# Patient Record
Sex: Male | Born: 1935 | Race: White | Hispanic: No | Smoking: Former smoker
Health system: Southern US, Community
[De-identification: ages and names within clinical notes are randomized; demographics above are authoritative.]

## PROBLEM LIST (undated history)

## (undated) DIAGNOSIS — I2699 Other pulmonary embolism without acute cor pulmonale: Secondary | ICD-10-CM

## (undated) DIAGNOSIS — E7800 Pure hypercholesterolemia, unspecified: Secondary | ICD-10-CM

## (undated) DIAGNOSIS — I4891 Unspecified atrial fibrillation: Secondary | ICD-10-CM

## (undated) DIAGNOSIS — Z5189 Encounter for other specified aftercare: Secondary | ICD-10-CM

## (undated) DIAGNOSIS — I2510 Atherosclerotic heart disease of native coronary artery without angina pectoris: Secondary | ICD-10-CM

## (undated) DIAGNOSIS — I82409 Acute embolism and thrombosis of unspecified deep veins of unspecified lower extremity: Secondary | ICD-10-CM

## (undated) HISTORY — PX: HIP SURGERY: SHX245

## (undated) HISTORY — PX: CORONARY ARTERY BYPASS GRAFT: SHX141

## (undated) HISTORY — PX: PARTIAL HIP ARTHROPLASTY: SHX733

## (undated) HISTORY — PX: IVC FILTER PLACEMENT (ARMC HX): HXRAD1551

## (undated) HISTORY — PX: CERVICAL SPINE SURGERY: SHX589

---

## 2002-12-28 ENCOUNTER — Encounter: Payer: Self-pay | Admitting: Orthopedic Surgery

## 2002-12-31 ENCOUNTER — Encounter: Payer: Self-pay | Admitting: Orthopedic Surgery

## 2002-12-31 ENCOUNTER — Inpatient Hospital Stay (HOSPITAL_COMMUNITY): Admission: RE | Admit: 2002-12-31 | Discharge: 2003-01-04 | Payer: Self-pay | Admitting: Orthopedic Surgery

## 2015-12-17 ENCOUNTER — Encounter (HOSPITAL_BASED_OUTPATIENT_CLINIC_OR_DEPARTMENT_OTHER): Payer: Self-pay | Admitting: *Deleted

## 2015-12-17 ENCOUNTER — Emergency Department (HOSPITAL_BASED_OUTPATIENT_CLINIC_OR_DEPARTMENT_OTHER)
Admission: EM | Admit: 2015-12-17 | Discharge: 2015-12-17 | Disposition: A | Discharge status: Home / Self Care | Payer: Medicare Other | Attending: Emergency Medicine | Admitting: Emergency Medicine

## 2015-12-17 ENCOUNTER — Emergency Department (HOSPITAL_BASED_OUTPATIENT_CLINIC_OR_DEPARTMENT_OTHER): Payer: Medicare Other

## 2015-12-17 ENCOUNTER — Telehealth (HOSPITAL_COMMUNITY): Payer: Self-pay

## 2015-12-17 DIAGNOSIS — Z880 Allergy status to penicillin: Secondary | ICD-10-CM | POA: Insufficient documentation

## 2015-12-17 DIAGNOSIS — E7800 Pure hypercholesterolemia, unspecified: Secondary | ICD-10-CM | POA: Insufficient documentation

## 2015-12-17 DIAGNOSIS — I2510 Atherosclerotic heart disease of native coronary artery without angina pectoris: Secondary | ICD-10-CM | POA: Insufficient documentation

## 2015-12-17 DIAGNOSIS — Z951 Presence of aortocoronary bypass graft: Secondary | ICD-10-CM | POA: Diagnosis not present

## 2015-12-17 DIAGNOSIS — Z86711 Personal history of pulmonary embolism: Secondary | ICD-10-CM | POA: Insufficient documentation

## 2015-12-17 DIAGNOSIS — Z791 Long term (current) use of non-steroidal anti-inflammatories (NSAID): Secondary | ICD-10-CM | POA: Insufficient documentation

## 2015-12-17 DIAGNOSIS — Z79899 Other long term (current) drug therapy: Secondary | ICD-10-CM | POA: Diagnosis not present

## 2015-12-17 DIAGNOSIS — Z87891 Personal history of nicotine dependence: Secondary | ICD-10-CM | POA: Insufficient documentation

## 2015-12-17 DIAGNOSIS — M79605 Pain in left leg: Secondary | ICD-10-CM | POA: Diagnosis present

## 2015-12-17 DIAGNOSIS — I82432 Acute embolism and thrombosis of left popliteal vein: Secondary | ICD-10-CM | POA: Diagnosis not present

## 2015-12-17 HISTORY — DX: Acute embolism and thrombosis of unspecified deep veins of unspecified lower extremity: I82.409

## 2015-12-17 HISTORY — DX: Other pulmonary embolism without acute cor pulmonale: I26.99

## 2015-12-17 HISTORY — DX: Atherosclerotic heart disease of native coronary artery without angina pectoris: I25.10

## 2015-12-17 HISTORY — DX: Encounter for other specified aftercare: Z51.89

## 2015-12-17 HISTORY — DX: Pure hypercholesterolemia, unspecified: E78.00

## 2015-12-17 HISTORY — DX: Unspecified atrial fibrillation: I48.91

## 2015-12-17 LAB — BASIC METABOLIC PANEL
ANION GAP: 4 — AB (ref 5–15)
BUN: 26 mg/dL — ABNORMAL HIGH (ref 6–20)
CO2: 24 mmol/L (ref 22–32)
Calcium: 8.6 mg/dL — ABNORMAL LOW (ref 8.9–10.3)
Chloride: 109 mmol/L (ref 101–111)
Creatinine, Ser: 1.43 mg/dL — ABNORMAL HIGH (ref 0.61–1.24)
GFR calc Af Amer: 52 mL/min — ABNORMAL LOW (ref >60)
GFR calc non Af Amer: 45 mL/min — ABNORMAL LOW (ref >60)
GLUCOSE: 111 mg/dL — AB (ref 65–99)
POTASSIUM: 4.5 mmol/L (ref 3.5–5.1)
Sodium: 137 mmol/L (ref 135–145)

## 2015-12-17 LAB — PROTIME-INR
INR: 0.95 (ref 0.00–1.49)
Prothrombin Time: 12.9 seconds (ref 11.6–15.2)

## 2015-12-17 MED ORDER — ENOXAPARIN SODIUM 40 MG/0.4ML ~~LOC~~ SOLN: 75.0000 mg | Freq: Two times a day (BID) | SUBCUTANEOUS | Status: AC

## 2015-12-17 MED ORDER — WARFARIN SODIUM 5 MG PO TABS: 5.0000 mg | ORAL_TABLET | Freq: Every day | ORAL | Status: AC

## 2015-12-17 MED ORDER — ENOXAPARIN SODIUM 80 MG/0.8ML ~~LOC~~ SOLN
77.0000 mg | Freq: Once | SUBCUTANEOUS | Status: AC
  Administered 2015-12-17: 75 mg via SUBCUTANEOUS
  Filled 2015-12-17: qty 0.8

## 2015-12-17 NOTE — ED Notes (Signed)
Pt voided in urinal

## 2015-12-17 NOTE — Telephone Encounter (Signed)
Incoming call from pharmacy regarding clarification of Lovenox Rx.  Call transferred to prescriber Anselmo RodSamantha N. Riley PA for clarification.

## 2015-12-17 NOTE — ED Notes (Signed)
Pt reports pain posterior right knee since yesterday- hx of blood clots- had IVC filter placed last year- [facility]- no longer taking coumadin

## 2015-12-17 NOTE — ED Notes (Signed)
PA at bedside.

## 2015-12-17 NOTE — ED Provider Notes (Signed)
CSN: [id number]     Arrival date & time 12/17/15  1133 History   First MD Initiated Contact with Patient 12/17/15 1208     Chief Complaint  Patient presents with  . Leg Pain   HPI   [name] 81 [facility] M PMH significant for DVT, PE, IVC filter (placed last July) presenting with a posterior right knee pain since yesterday. He endorses right popliteal pain since yesterday. He describes the pain as 4/10 pain scale, dull, non-radiating, constant. He states with his first DVT, he had significant pain, but with his last DVT, he did not have pain at all, so he does not know how this pain compares to previous DVTs. [facility]. No fevers, chills, injury, CP, SOB, palpitations, leg swelling, color changes.   Past Medical History  Diagnosis Date  . A-fib (HCC)   . DVT (deep venous thrombosis) (HCC)   . PE (pulmonary embolism)   . Coronary artery disease   . High cholesterol   . Blood transfusion without reported diagnosis    Past Surgical History  Procedure Laterality Date  . Coronary artery bypass graft    . Ivc filter placement ([facility] hx)    . Partial hip arthroplasty    . Hip surgery    . Cervical spine surgery     No family history on file. Social History  Substance Use Topics  . Smoking status: Former Games developermoker  . Smokeless tobacco: Never Used  . Alcohol Use: No    Review of Systems  Ten systems are reviewed and are negative for acute change except as noted in the HPI  Allergies  Levaquin and Penicillins  Home Medications   Prior to Admission medications   Medication Sig Start Date End Date Taking? Authorizing Provider  atorvastatin (LIPITOR) 80 MG tablet Take 80 mg by mouth daily.   Yes Historical Provider, MD  diclofenac (VOLTAREN) 75 MG EC tablet Take 75 mg by mouth 2 (two) times daily.   Yes Historical Provider, MD  omeprazole (PRILOSEC) 20 MG capsule Take 20 mg by mouth daily.   Yes Historical Provider, MD  polyethylene glycol (MIRALAX / GLYCOLAX)  packet Take 17 g by mouth daily as needed.   Yes Historical Provider, MD   BP 178/60 mmHg  Pulse 70  Temp(Src) 98.7 F (37.1 C) (Oral)  Resp 16  SpO2 98% Physical Exam  Constitutional: He appears well-developed and well-nourished. No distress.  HENT:  Head: Normocephalic and atraumatic.  Mouth/Throat: Oropharynx is clear and moist. No oropharyngeal exudate.  Eyes: Conjunctivae are normal. Pupils are equal, round, and reactive to light. Right eye exhibits no discharge. Left eye exhibits no discharge. No scleral icterus.  Neck: No tracheal deviation present.  Cardiovascular: Normal rate, regular rhythm, normal heart sounds and intact distal pulses.  Exam reveals no gallop and no friction rub.   No murmur heard. Pulmonary/Chest: Effort normal and breath sounds normal. No respiratory distress. He has no wheezes. He has no rales. He exhibits no tenderness.  Abdominal: Soft. Bowel sounds are normal. He exhibits no distension and no mass. There is no tenderness. There is no rebound and no guarding.  Musculoskeletal: Normal range of motion. He exhibits edema and tenderness.  Left knee edematous (chronic in nature). Slight tenderness right popliteal. Right leg without swelling, pallor, erythema. Neurovascularly intact BL  Lymphadenopathy:    He has no cervical adenopathy.  Neurological: He is alert. Coordination normal.  Skin: Skin is warm and dry. No rash  noted. He is not diaphoretic. No erythema.  Psychiatric: He has a normal mood and affect. His behavior is normal.  Nursing note and vitals reviewed.   ED Course  Procedures  Labs Review Labs Reviewed  BASIC METABOLIC PANEL - Abnormal; Notable for the following:    Glucose, Bld 111 (*)    BUN 26 (*)    Creatinine, Ser 1.43 (*)    Calcium 8.6 (*)    GFR calc non Af Amer 45 (*)    GFR calc Af Amer 52 (*)    Anion gap 4 (*)    All other components within normal limits  PROTIME-INR    Imaging Review US Venous Img Lower Unilateral  Right  12/17/2015  CLINICAL DATA:  Right posterior knee pain for 2 days after prolonged cart travel. History of IVC filter. EXAM: RIGHT LOWER EXTREMITY VENOUS DOPPLER ULTRASOUND TECHNIQUE: Gray-scale sonography with graded compression, as well as color Doppler and duplex ultrasound were performed to evaluate the lower extremity deep venous systems from the level of the common femoral vein and including the common femoral, femoral, profunda femoral, popliteal and calf veins including the posterior tibial, peroneal and gastrocnemius veins when visible. The superficial great saphenous vein was also interrogated. Spectral Doppler was utilized to evaluate flow at rest and with distal augmentation maneuvers in the common femoral, femoral and popliteal veins. COMPARISON:  None. FINDINGS: Contralateral Common Femoral Vein: Respiratory phasicity is normal and symmetric with the symptomatic side. No evidence of thrombus. Normal compressibility. Common Femoral Vein: No evidence of thrombus. Normal compressibility, respiratory phasicity and response to augmentation. Saphenofemoral Junction: No evidence of thrombus. Normal compressibility and flow on color Doppler imaging. Profunda Femoral Vein: No evidence of thrombus. Normal compressibility and flow on color Doppler imaging. Femoral Vein: No evidence of thrombus. Normal compressibility, respiratory phasicity and response to augmentation. Popliteal Vein: There is a partially occlusive blood clot within the right popliteal vein which demonstrate decreased plasticity and compressibility. Calf Veins: No evidence of thrombus. Normal compressibility and flow on color Doppler imaging. Superficial Great Saphenous Vein: No evidence of thrombus. Normal compressibility and flow on color Doppler imaging. Venous Reflux:  None. Other Findings:  None. IMPRESSION: Partially occlusive blood clot within the right popliteal vein. These results were called by telephone at the time of  interpretation on 12/17/2015 at 2:13 pm to Althea Grimmer , PA who verbally acknowledged these results. Electronically Signed   By: Ted Mcalpine M.D.   On: 12/17/2015 14:19   I have personally reviewed and evaluated these images and lab results as part of my medical decision-making.  MDM   Final diagnoses:  Acute deep vein thrombosis (DVT) of popliteal vein of left lower extremity (HCC)   Patient non-toxic appearing. Hypertensive on exam. US reveals partially occlusive blood clot within the right popliteal vein. Patient will be started on anticoagulation therapy. Discussed case with Dr. Clydene Pugh. Patient may be safely discharged home. Discussed reasons for return. Patient to follow-up with primary care provider within a few days. Patient in understanding and agreement with the plan.    Melton Krebs, PA-C 12/31/15 1403  Lyndal Pulley, MD 01/01/16 1248

## 2015-12-17 NOTE — Discharge Instructions (Signed)
Mr. [facility],  Nice meeting you! Please follow-up with your primary care provider on Tuesday. Take Lovenox twice a day and start taking Coumadin tomorrow. Do both of these until you see your doctor and get your blood levels checked. Your INR today was 0.95, and PT was 12.9. You did have an elevated creatinine of 1.43 and a BUN of 26, so stay hydrated, and follow-up with your primary care provider regarding this. Return to the emergency department if you develop increasing pain, chest pain, shortness of breath, your leg changes colors or become cold. Feel better soon!  S. Lane Hacker, PA-C   Deep Vein Thrombosis A deep vein thrombosis (DVT) is a blood clot (thrombus) that usually occurs in a deep, larger vein of the lower leg or the pelvis, or in an upper extremity such as the arm. These are dangerous and can lead to serious and even life-threatening complications if the clot travels to the lungs. A DVT can damage the valves in your leg veins so that instead of flowing upward, the blood pools in the lower leg. This is called post-thrombotic syndrome, and it can result in pain, swelling, discoloration, and sores on the leg. CAUSES A DVT is caused by the formation of a blood clot in your leg, pelvis, or arm. Usually, several things contribute to the formation of blood clots. A clot may develop when:  Your blood flow slows down.  Your vein becomes damaged in some way.  You have a condition that makes your blood clot more easily. RISK FACTORS A DVT is more likely to develop in:  People who are older, especially over 30 years of age.  People who are overweight (obese).  People who sit or lie still for a long time, such as during long-distance travel (over 4 hours), bed rest, hospitalization, or during recovery from certain medical conditions like a stroke.  People who do not engage in much physical activity (sedentary lifestyle).  People who have chronic breathing disorders.  People  who have a personal or family history of blood clots or blood clotting disease.  People who have peripheral vascular disease (PVD), diabetes, or some types of cancer.  People who have heart disease, especially if the person had a recent heart attack or has congestive heart failure.  People who have neurological diseases that affect the legs (leg paresis).  People who have had a traumatic injury, such as breaking a hip or leg.  People who have recently had major or lengthy surgery, especially on the hip, knee, or abdomen.  People who have had a central line placed inside a large vein.  People who take medicines that contain the hormone estrogen. These include birth control pills and hormone replacement therapy.  Pregnancy or during childbirth or the postpartum period.  Long plane flights (over 8 hours). SIGNS AND SYMPTOMS Symptoms of a DVT can include:   Swelling of your leg or arm, especially if one side is much worse.  Warmth and redness of your leg or arm, especially if one side is much worse.  Pain in your arm or leg. If the clot is in your leg, symptoms may be more noticeable or worse when you stand or walk.  A feeling of pins and needles, if the clot is in the arm. The symptoms of a DVT that has traveled to the lungs (pulmonary embolism, PE) usually start suddenly and include:  Shortness of breath while active or at rest.  Coughing or coughing up blood or blood-tinged  mucus.  Chest pain that is often worse with deep breaths.  Rapid or irregular heartbeat.  Feeling light-headed or dizzy.  Fainting.  Feeling anxious.  Sweating. There may also be pain and swelling in a leg if that is where the blood clot started. These symptoms may represent a serious problem that is an emergency. Do not wait to see if the symptoms will go away. Get medical help right away. Call your local emergency services (911 in the [place] Do not drive yourself to the hospital. DIAGNOSIS Your  health care provider will take a medical history and perform a physical exam. You may also have other tests, including:  Blood tests to assess the clotting properties of your blood.  Imaging tests, such as CT, ultrasound, MRI, X-ray, and other tests to see if you have clots anywhere in your body. TREATMENT After a DVT is identified, it can be treated. The type of treatment that you receive depends on many factors, such as the cause of your DVT, your risk for bleeding or developing more clots, and other medical conditions that you have. Sometimes, a combination of treatments is necessary. Treatment options may be combined and include:  Monitoring the blood clot with ultrasound.  Taking medicines by mouth, such as newer blood thinners (anticoagulants), thrombolytics, or warfarin.  Taking anticoagulant medicine by injection or through an IV tube.  Wearing compression stockings or using different types ofdevices.  Surgery (rare) to remove the blood clot or to place a filter in your abdomen to stop the blood clot from traveling to your lungs. Treatments for a DVT are often divided into immediate treatment and long-term treatment (up to 3 months after DVT). You can work with your health care provider to choose the treatment program that is best for you. HOME CARE INSTRUCTIONS If you are taking a newer oral anticoagulant:  Take the medicine every single day at the same time each day.  Understand what foods and drugs interact with this medicine.  Understand that there are no regular blood tests required when using this medicine.  Understand the side effects of this medicine, including excessive bruising or bleeding. Ask your health care provider or pharmacist about other possible side effects. If you are taking warfarin:  Understand how to take warfarin and know which foods can affect how warfarin works in Public relations account executive.  Understand that it is dangerous to take too much or too little warfarin.  Too much warfarin increases the risk of bleeding. Too little warfarin continues to allow the risk for blood clots.  Follow your PT and INR blood testing schedule. The PT and INR results allow your health care provider to adjust your dose of warfarin. It is very important that you have your PT and INR tested as often as told by your health care provider.  Avoid major changes in your diet, or tell your health care provider before you change your diet. Arrange a visit with a registered dietitian to answer your questions. Many foods, especially foods that are high in vitamin K, can interfere with warfarin and affect the PT and INR results. Eat a consistent amount of foods that are high in vitamin K, such as:  Spinach, kale, broccoli, cabbage, collard greens, turnip greens, Brussels sprouts, peas, cauliflower, seaweed, and parsley.  Beef liver and pork liver.  Green tea.  Soybean oil.  Tell your health care provider about any and all medicines, vitamins, and supplements that you take, including aspirin and other over-the-counter anti-inflammatory medicines. Be especially  cautious with aspirin and anti-inflammatory medicines. Do not take those before you ask your health care provider if it is safe to do so. This is important because many medicines can interfere with warfarin and affect the PT and INR results.  Do not start or stop taking any over-the-counter or prescription medicine unless your health care provider or pharmacist tells you to do so. If you take warfarin, you will also need to do these things:  Hold pressure over cuts for longer than usual.  Tell your dentist and other health care providers that you are taking warfarin before you have any procedures in which bleeding may occur.  Avoid alcohol or drink very small amounts. Tell your health care provider if you change your alcohol intake.  Do not use tobacco products, including cigarettes, chewing tobacco, and e-cigarettes. If you need  help quitting, ask your health care provider.  Avoid contact sports. General Instructions  Take over-the-counter and prescription medicines only as told by your health care provider. Anticoagulant medicines can have side effects, including easy bruising and difficulty stopping bleeding. If you are prescribed an anticoagulant, you will also need to do these things:  Hold pressure over cuts for longer than usual.  Tell your dentist and other health care providers that you are taking anticoagulants before you have any procedures in which bleeding may occur.  Avoid contact sports.  Wear a medical alert bracelet or carry a medical alert card that says you have had a PE.  Ask your health care provider how soon you can go back to your normal activities. Stay active to prevent new blood clots from forming.  Make sure to exercise while traveling or when you have been sitting or standing for a long period of time. It is very important to exercise. Exercise your legs by walking or by tightening and relaxing your leg muscles often. Take frequent walks.  Wear compression stockings as told by your health care provider to help prevent more blood clots from forming.  Do not use tobacco products, including cigarettes, chewing tobacco, and e-cigarettes. If you need help quitting, ask your health care provider.  Keep all follow-up appointments with your health care provider. This is important. PREVENTION Take these actions to decrease your risk of developing another DVT:  Exercise regularly. For at least 30 minutes every day, engage in:  Activity that involves moving your arms and legs.  Activity that encourages good blood flow through your body by increasing your heart rate.  Exercise your arms and legs every hour during long-distance travel (over 4 hours). Drink plenty of water and avoid drinking alcohol while traveling.  Avoid sitting or lying in bed for long periods of time without moving your  legs.  Maintain a weight that is appropriate for your height. Ask your health care provider what weight is healthy for you.  If you are a woman who is over 79 years of age, avoid unnecessary use of medicines that contain estrogen. These include birth control pills.  Do not smoke, especially if you take estrogen medicines. If you need help quitting, ask your health care provider. If you are hospitalized, prevention measures may include:  Early walking after surgery, as soon as your health care provider says that it is safe.  Receiving anticoagulants to prevent blood clots.If you cannot take anticoagulants, other options may be available, such as wearing compression stockings or using different types of devices. SEEK IMMEDIATE MEDICAL CARE IF:  You have new or increased pain, swelling, or  redness in an arm or leg.  You have numbness or tingling in an arm or leg.  You have shortness of breath while active or at rest.  You have chest pain.  You have a rapid or irregular heartbeat.  You feel light-headed or dizzy.  You cough up blood.  You notice blood in your vomit, bowel movement, or urine. These symptoms may represent a serious problem that is an emergency. Do not wait to see if the symptoms will go away. Get medical help right away. Call your local emergency services (911 in the [place] Do not drive yourself to the hospital.   This information is not intended to replace advice given to you by your health care provider. Make sure you discuss any questions you have with your health care provider.   Document Released: 12/09/2005 Document Revised: 08/30/2015 Document Reviewed: 04/05/2015 Elsevier Interactive Patient Education 2016 [email or web address]. d

## 2016-01-31 IMAGING — US US EXTREM LOW VENOUS*R*
1 series · 13 of 24 positions shown · non-contrast
Comparison: None.

CLINICAL DATA: Right posterior knee pain for 2 days after prolonged
cart travel. History of IVC filter.



[Series 1: us extrem low venous*right* · 0.08mm/px · 13 of 36 slices shown]
[im 1/36]
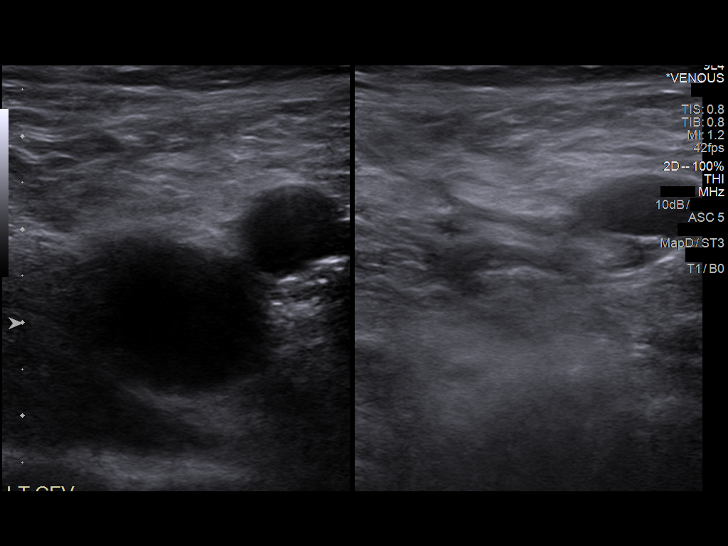
[im 4/36]
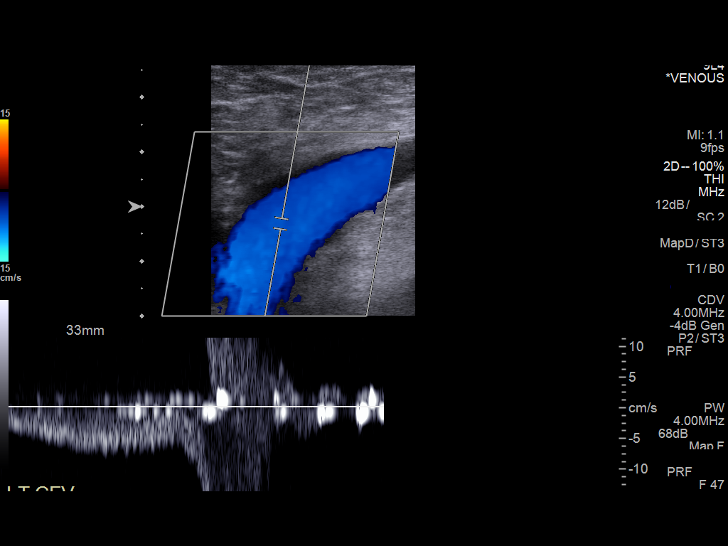
[im 7/36]
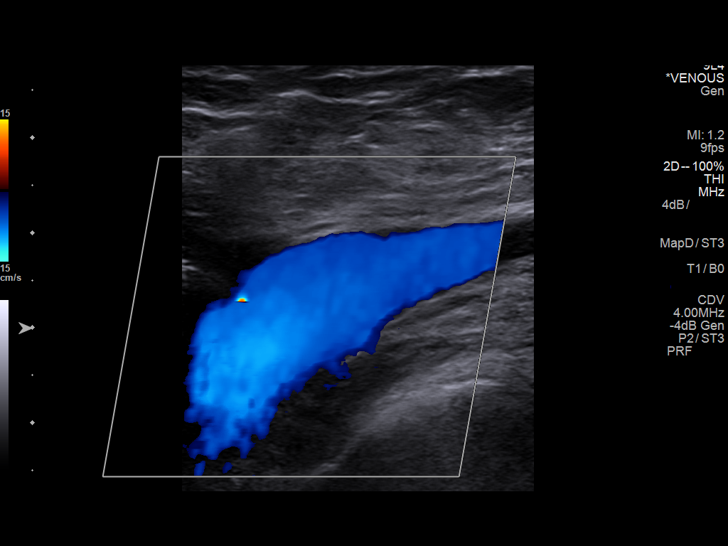
[im 10/36]
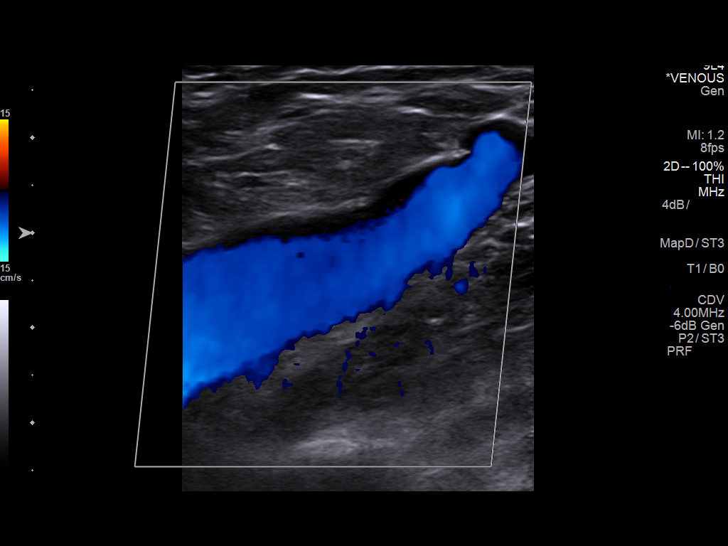
[im 13/36]
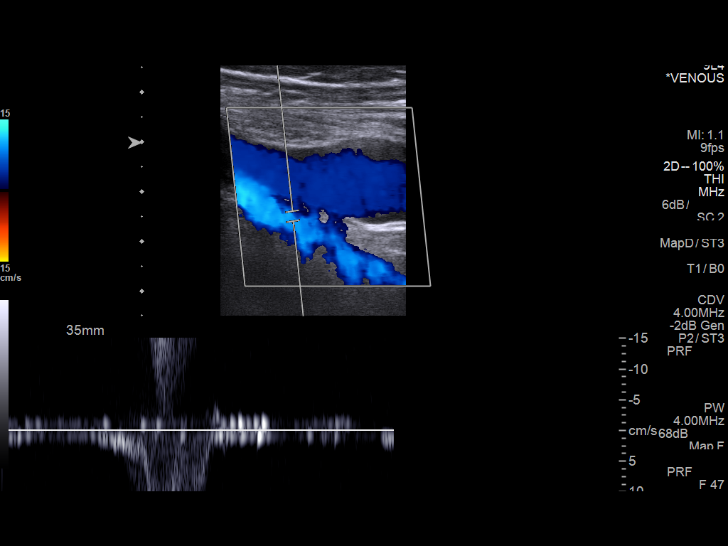
[im 16/36]
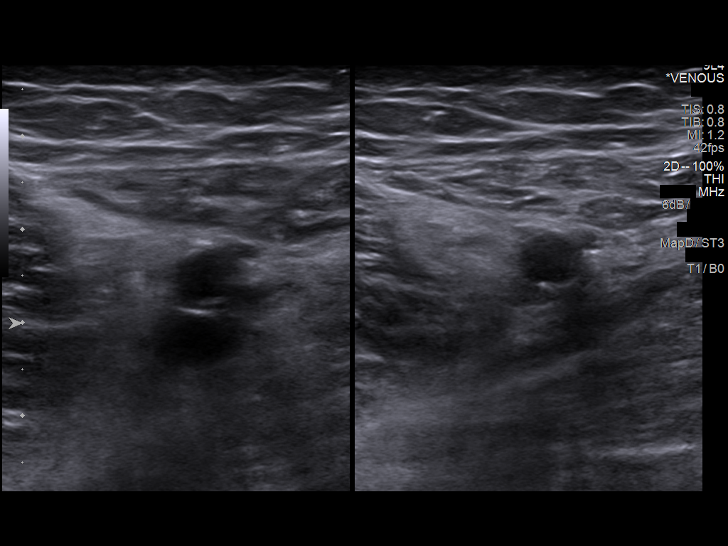
[im 19/36]
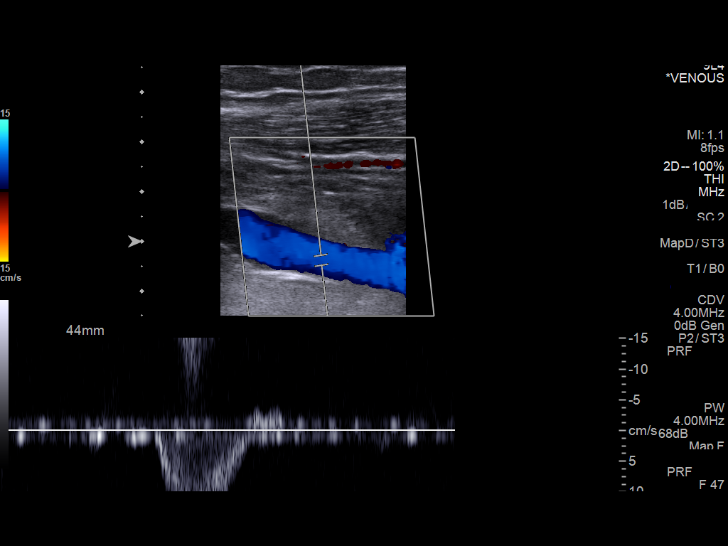
[im 20/36]
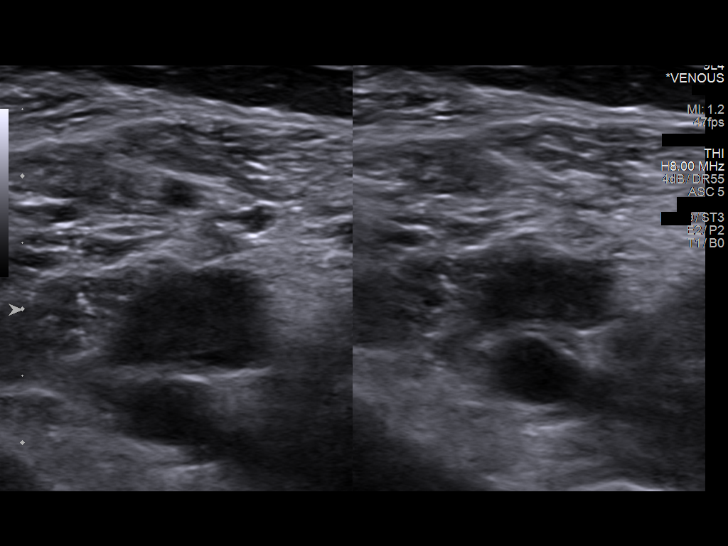
[im 23/36]
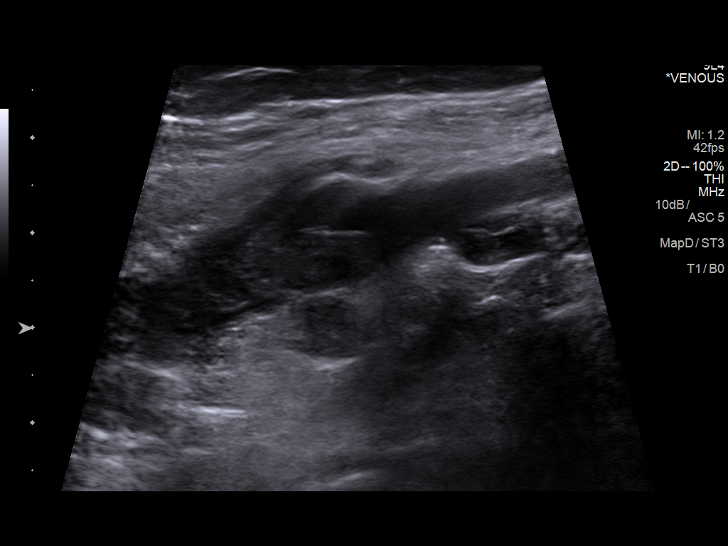
[im 26/36]
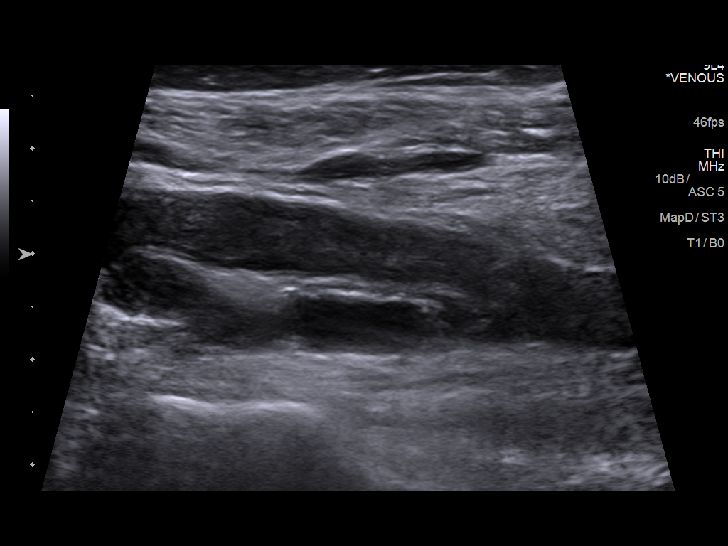
[im 29/36]
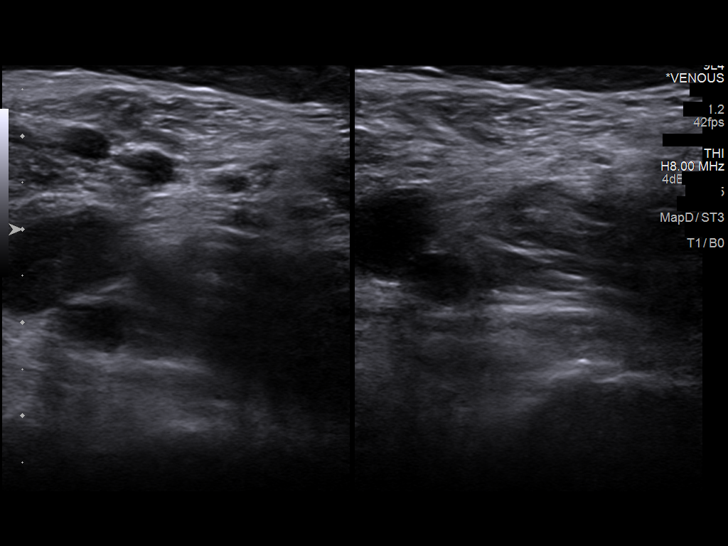
[im 32/36]
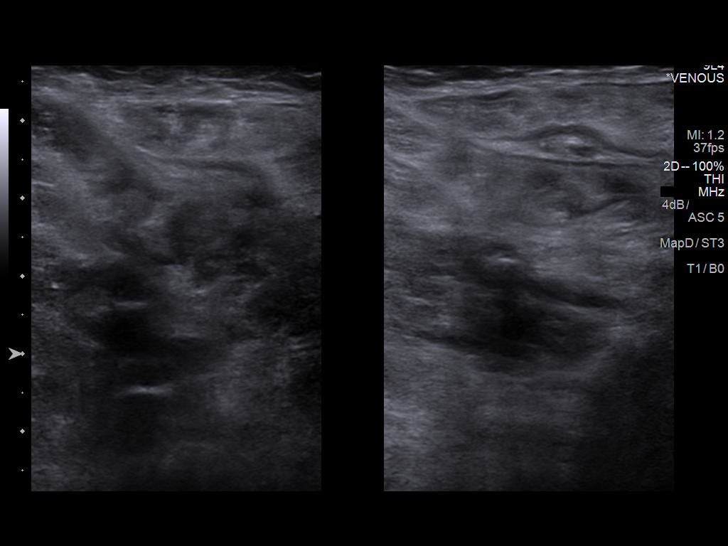
[im 36/36]
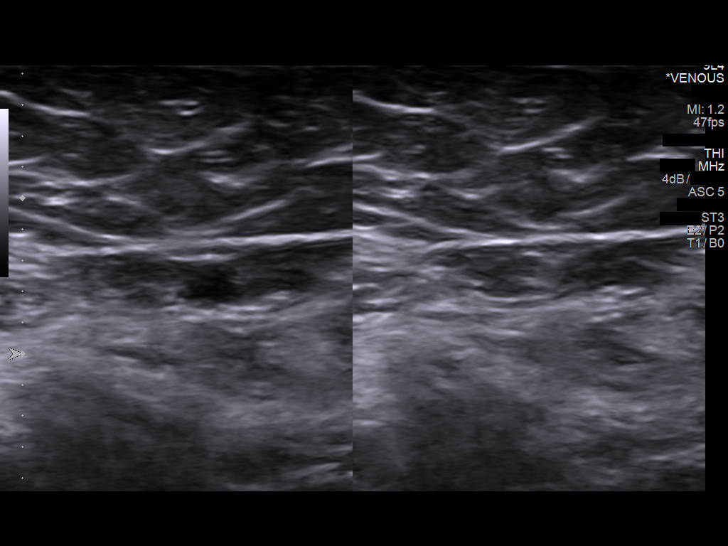

[13 of 24 positions shown; findings below may reference images not displayed]

FINDINGS: Contralateral Common Femoral Vein: Respiratory phasicity is normal
and symmetric with the symptomatic side. No evidence of thrombus.
Normal compressibility.

Common Femoral Vein: No evidence of thrombus. Normal
compressibility, respiratory phasicity and response to augmentation.

Saphenofemoral Junction: No evidence of thrombus. Normal
compressibility and flow on color Doppler imaging.

Profunda Femoral Vein: No evidence of thrombus. Normal
compressibility and flow on color Doppler imaging.

Femoral Vein: No evidence of thrombus. Normal compressibility,
respiratory phasicity and response to augmentation.

Popliteal Vein: There is a partially occlusive blood clot within the
right popliteal vein which demonstrate decreased plasticity and
compressibility.

Calf Veins: No evidence of thrombus. Normal compressibility and flow
on color Doppler imaging.

Superficial Great Saphenous Vein: No evidence of thrombus. Normal
compressibility and flow on color Doppler imaging.

Venous Reflux:  None.

Other Findings:  None.
IMPRESSION: Partially occlusive blood clot within the right popliteal vein.

These results were called by telephone at the time of interpretation
on 12/17/2015 at [DATE] to Joami Egas , PA who verbally
acknowledged these results.
